# Patient Record
Sex: Female | Born: 1960
Health system: Southern US, Community
[De-identification: ages and names within clinical notes are randomized; demographics above are authoritative.]

## PROBLEM LIST (undated history)

## (undated) DIAGNOSIS — Z803 Family history of malignant neoplasm of breast: Secondary | ICD-10-CM

## (undated) DIAGNOSIS — N200 Calculus of kidney: Secondary | ICD-10-CM

## (undated) HISTORY — DX: Family history of malignant neoplasm of breast: Z80.3

## (undated) HISTORY — DX: Calculus of kidney: N20.0

---

## 1999-03-06 ENCOUNTER — Encounter: Payer: Self-pay | Admitting: Urology

## 1999-03-06 ENCOUNTER — Ambulatory Visit (HOSPITAL_COMMUNITY): Admission: RE | Admit: 1999-03-06 | Discharge: 1999-03-06 | Payer: Self-pay | Admitting: Urology

## 1999-03-07 ENCOUNTER — Emergency Department (HOSPITAL_COMMUNITY): Admission: EM | Admit: 1999-03-07 | Discharge: 1999-03-07 | Payer: Self-pay | Admitting: Emergency Medicine

## 1999-05-15 ENCOUNTER — Other Ambulatory Visit: Admission: RE | Admit: 1999-05-15 | Discharge: 1999-05-15 | Payer: Self-pay | Admitting: *Deleted

## 2000-05-27 ENCOUNTER — Other Ambulatory Visit: Admission: RE | Admit: 2000-05-27 | Discharge: 2000-05-27 | Payer: Self-pay | Admitting: *Deleted

## 2001-08-15 ENCOUNTER — Other Ambulatory Visit: Admission: RE | Admit: 2001-08-15 | Discharge: 2001-08-15 | Payer: Self-pay | Admitting: *Deleted

## 2002-05-28 ENCOUNTER — Other Ambulatory Visit: Admission: RE | Admit: 2002-05-28 | Discharge: 2002-05-28 | Payer: Self-pay | Admitting: *Deleted

## 2003-12-25 ENCOUNTER — Encounter: Admission: RE | Admit: 2003-12-25 | Discharge: 2003-12-25 | Payer: Self-pay | Admitting: Family Medicine

## 2004-09-08 ENCOUNTER — Other Ambulatory Visit: Admission: RE | Admit: 2004-09-08 | Discharge: 2004-09-08 | Payer: Self-pay | Admitting: Family Medicine

## 2005-10-04 ENCOUNTER — Other Ambulatory Visit: Admission: RE | Admit: 2005-10-04 | Discharge: 2005-10-04 | Payer: Self-pay | Admitting: Family Medicine

## 2005-11-08 ENCOUNTER — Encounter: Payer: Self-pay | Admitting: Family Medicine

## 2005-11-30 ENCOUNTER — Encounter: Admission: RE | Admit: 2005-11-30 | Discharge: 2005-11-30 | Payer: Self-pay | Admitting: Family Medicine

## 2006-11-28 ENCOUNTER — Encounter: Admission: RE | Admit: 2006-11-28 | Discharge: 2006-11-28 | Payer: Self-pay | Admitting: Family Medicine

## 2007-12-28 ENCOUNTER — Encounter: Admission: RE | Admit: 2007-12-28 | Discharge: 2007-12-28 | Payer: Self-pay | Admitting: Family Medicine

## 2009-02-06 ENCOUNTER — Encounter: Admission: RE | Admit: 2009-02-06 | Discharge: 2009-02-06 | Payer: Self-pay | Admitting: Family Medicine

## 2009-02-13 ENCOUNTER — Encounter: Admission: RE | Admit: 2009-02-13 | Discharge: 2009-02-13 | Payer: Self-pay | Admitting: Family Medicine

## 2010-05-18 ENCOUNTER — Encounter: Admission: RE | Admit: 2010-05-18 | Discharge: 2010-05-18 | Payer: Self-pay | Admitting: Family Medicine

## 2010-11-04 ENCOUNTER — Encounter: Admission: RE | Admit: 2010-11-04 | Discharge: 2010-11-04 | Payer: Self-pay | Admitting: Family Medicine

## 2011-01-03 ENCOUNTER — Encounter: Payer: Self-pay | Admitting: Family Medicine

## 2011-07-27 ENCOUNTER — Other Ambulatory Visit: Payer: Self-pay | Admitting: Family Medicine

## 2011-07-27 ENCOUNTER — Other Ambulatory Visit (HOSPITAL_COMMUNITY)
Admission: RE | Admit: 2011-07-27 | Discharge: 2011-07-27 | Disposition: A | Discharge status: Home / Self Care | Payer: 59 | Source: Ambulatory Visit | Attending: Family Medicine | Admitting: Family Medicine

## 2011-07-27 DIAGNOSIS — Z1159 Encounter for screening for other viral diseases: Secondary | ICD-10-CM | POA: Insufficient documentation

## 2011-07-27 DIAGNOSIS — Z124 Encounter for screening for malignant neoplasm of cervix: Secondary | ICD-10-CM | POA: Insufficient documentation

## 2012-07-05 ENCOUNTER — Other Ambulatory Visit: Payer: Self-pay | Admitting: Family Medicine

## 2012-07-05 DIAGNOSIS — Z1231 Encounter for screening mammogram for malignant neoplasm of breast: Secondary | ICD-10-CM

## 2012-07-17 ENCOUNTER — Ambulatory Visit
Admission: RE | Admit: 2012-07-17 | Discharge: 2012-07-17 | Disposition: A | Discharge status: Home / Self Care | Payer: 59 | Source: Ambulatory Visit | Attending: Family Medicine | Admitting: Family Medicine

## 2012-07-17 DIAGNOSIS — Z1231 Encounter for screening mammogram for malignant neoplasm of breast: Secondary | ICD-10-CM

## 2013-07-10 ENCOUNTER — Other Ambulatory Visit: Payer: Self-pay

## 2013-07-10 DIAGNOSIS — Z1231 Encounter for screening mammogram for malignant neoplasm of breast: Secondary | ICD-10-CM

## 2013-07-25 ENCOUNTER — Ambulatory Visit: Payer: 59

## 2013-08-29 ENCOUNTER — Ambulatory Visit
Admission: RE | Admit: 2013-08-29 | Discharge: 2013-08-29 | Disposition: A | Discharge status: Home / Self Care | Payer: 59 | Source: Ambulatory Visit

## 2013-08-29 DIAGNOSIS — Z1231 Encounter for screening mammogram for malignant neoplasm of breast: Secondary | ICD-10-CM

## 2013-08-31 ENCOUNTER — Other Ambulatory Visit: Payer: Self-pay

## 2013-08-31 DIAGNOSIS — R928 Other abnormal and inconclusive findings on diagnostic imaging of breast: Secondary | ICD-10-CM

## 2013-09-14 ENCOUNTER — Ambulatory Visit
Admission: RE | Admit: 2013-09-14 | Discharge: 2013-09-14 | Disposition: A | Discharge status: Home / Self Care | Payer: 59 | Source: Ambulatory Visit

## 2013-09-14 DIAGNOSIS — R928 Other abnormal and inconclusive findings on diagnostic imaging of breast: Secondary | ICD-10-CM

## 2014-02-11 ENCOUNTER — Other Ambulatory Visit: Payer: Self-pay | Admitting: Family Medicine

## 2014-02-11 DIAGNOSIS — R928 Other abnormal and inconclusive findings on diagnostic imaging of breast: Secondary | ICD-10-CM

## 2014-02-21 ENCOUNTER — Ambulatory Visit
Admission: RE | Admit: 2014-02-21 | Discharge: 2014-02-21 | Disposition: A | Discharge status: Home / Self Care | Payer: 59 | Source: Ambulatory Visit | Attending: Family Medicine | Admitting: Family Medicine

## 2014-02-21 DIAGNOSIS — R928 Other abnormal and inconclusive findings on diagnostic imaging of breast: Secondary | ICD-10-CM

## 2014-08-27 ENCOUNTER — Other Ambulatory Visit: Payer: Self-pay

## 2014-08-27 DIAGNOSIS — Z1231 Encounter for screening mammogram for malignant neoplasm of breast: Secondary | ICD-10-CM

## 2014-09-10 ENCOUNTER — Ambulatory Visit
Admission: RE | Admit: 2014-09-10 | Discharge: 2014-09-10 | Disposition: A | Discharge status: Home / Self Care | Payer: 59 | Source: Ambulatory Visit

## 2014-09-10 DIAGNOSIS — Z1231 Encounter for screening mammogram for malignant neoplasm of breast: Secondary | ICD-10-CM

## 2015-06-24 ENCOUNTER — Other Ambulatory Visit: Payer: Self-pay | Admitting: Orthopedic Surgery

## 2015-06-24 DIAGNOSIS — Q6689 Other  specified congenital deformities of feet: Secondary | ICD-10-CM

## 2015-06-27 ENCOUNTER — Other Ambulatory Visit: Payer: Self-pay

## 2015-07-07 ENCOUNTER — Ambulatory Visit
Admission: RE | Admit: 2015-07-07 | Discharge: 2015-07-07 | Disposition: A | Discharge status: Home / Self Care | Payer: 59 | Source: Ambulatory Visit | Attending: Orthopedic Surgery | Admitting: Orthopedic Surgery

## 2015-07-07 DIAGNOSIS — Q6689 Other  specified congenital deformities of feet: Secondary | ICD-10-CM

## 2015-09-08 ENCOUNTER — Other Ambulatory Visit: Payer: Self-pay

## 2015-09-08 DIAGNOSIS — Z1231 Encounter for screening mammogram for malignant neoplasm of breast: Secondary | ICD-10-CM

## 2015-10-13 ENCOUNTER — Inpatient Hospital Stay: Admission: RE | Admit: 2015-10-13 | Payer: 59 | Source: Ambulatory Visit

## 2015-11-03 ENCOUNTER — Ambulatory Visit
Admission: RE | Admit: 2015-11-03 | Discharge: 2015-11-03 | Disposition: A | Discharge status: Home / Self Care | Payer: 59 | Source: Ambulatory Visit

## 2015-11-03 DIAGNOSIS — Z1231 Encounter for screening mammogram for malignant neoplasm of breast: Secondary | ICD-10-CM

## 2015-11-17 ENCOUNTER — Other Ambulatory Visit (HOSPITAL_COMMUNITY)
Admission: RE | Admit: 2015-11-17 | Discharge: 2015-11-17 | Disposition: A | Discharge status: Home / Self Care | Payer: 59 | Source: Ambulatory Visit | Attending: Family Medicine | Admitting: Family Medicine

## 2015-11-17 ENCOUNTER — Other Ambulatory Visit: Payer: Self-pay | Admitting: Family Medicine

## 2015-11-17 DIAGNOSIS — Z124 Encounter for screening for malignant neoplasm of cervix: Secondary | ICD-10-CM | POA: Diagnosis not present

## 2015-11-18 LAB — CYTOLOGY - PAP

## 2016-03-15 DIAGNOSIS — E7800 Pure hypercholesterolemia, unspecified: Secondary | ICD-10-CM | POA: Diagnosis not present

## 2016-05-04 DIAGNOSIS — M7502 Adhesive capsulitis of left shoulder: Secondary | ICD-10-CM | POA: Diagnosis not present

## 2016-05-04 DIAGNOSIS — M25512 Pain in left shoulder: Secondary | ICD-10-CM | POA: Diagnosis not present

## 2016-05-04 DIAGNOSIS — G8929 Other chronic pain: Secondary | ICD-10-CM | POA: Diagnosis not present

## 2016-05-17 DIAGNOSIS — M7502 Adhesive capsulitis of left shoulder: Secondary | ICD-10-CM | POA: Diagnosis not present

## 2016-05-31 DIAGNOSIS — M7502 Adhesive capsulitis of left shoulder: Secondary | ICD-10-CM | POA: Diagnosis not present

## 2016-06-21 DIAGNOSIS — M7502 Adhesive capsulitis of left shoulder: Secondary | ICD-10-CM | POA: Diagnosis not present

## 2016-07-23 DIAGNOSIS — Z8249 Family history of ischemic heart disease and other diseases of the circulatory system: Secondary | ICD-10-CM | POA: Diagnosis not present

## 2016-07-23 DIAGNOSIS — M62838 Other muscle spasm: Secondary | ICD-10-CM | POA: Diagnosis not present

## 2016-07-23 DIAGNOSIS — E559 Vitamin D deficiency, unspecified: Secondary | ICD-10-CM | POA: Diagnosis not present

## 2016-07-23 DIAGNOSIS — E782 Mixed hyperlipidemia: Secondary | ICD-10-CM | POA: Diagnosis not present

## 2016-08-23 ENCOUNTER — Other Ambulatory Visit: Payer: Self-pay | Admitting: Family Medicine

## 2016-08-23 DIAGNOSIS — Z86018 Personal history of other benign neoplasm: Secondary | ICD-10-CM | POA: Diagnosis not present

## 2016-08-23 DIAGNOSIS — M62838 Other muscle spasm: Secondary | ICD-10-CM | POA: Diagnosis not present

## 2016-08-23 DIAGNOSIS — E559 Vitamin D deficiency, unspecified: Secondary | ICD-10-CM | POA: Diagnosis not present

## 2016-08-23 DIAGNOSIS — Z1231 Encounter for screening mammogram for malignant neoplasm of breast: Secondary | ICD-10-CM

## 2016-08-23 DIAGNOSIS — Z85828 Personal history of other malignant neoplasm of skin: Secondary | ICD-10-CM | POA: Diagnosis not present

## 2016-08-23 DIAGNOSIS — R739 Hyperglycemia, unspecified: Secondary | ICD-10-CM | POA: Diagnosis not present

## 2016-08-23 DIAGNOSIS — B079 Viral wart, unspecified: Secondary | ICD-10-CM | POA: Diagnosis not present

## 2016-08-23 DIAGNOSIS — E782 Mixed hyperlipidemia: Secondary | ICD-10-CM | POA: Diagnosis not present

## 2016-08-23 DIAGNOSIS — L814 Other melanin hyperpigmentation: Secondary | ICD-10-CM | POA: Diagnosis not present

## 2016-08-23 DIAGNOSIS — D225 Melanocytic nevi of trunk: Secondary | ICD-10-CM | POA: Diagnosis not present

## 2016-08-23 DIAGNOSIS — D485 Neoplasm of uncertain behavior of skin: Secondary | ICD-10-CM | POA: Diagnosis not present

## 2016-09-06 DIAGNOSIS — L570 Actinic keratosis: Secondary | ICD-10-CM | POA: Diagnosis not present

## 2016-09-22 DIAGNOSIS — Z23 Encounter for immunization: Secondary | ICD-10-CM | POA: Diagnosis not present

## 2016-10-20 DIAGNOSIS — R350 Frequency of micturition: Secondary | ICD-10-CM | POA: Diagnosis not present

## 2016-10-20 DIAGNOSIS — N200 Calculus of kidney: Secondary | ICD-10-CM | POA: Diagnosis not present

## 2016-10-25 DIAGNOSIS — J069 Acute upper respiratory infection, unspecified: Secondary | ICD-10-CM | POA: Diagnosis not present

## 2016-10-25 DIAGNOSIS — H109 Unspecified conjunctivitis: Secondary | ICD-10-CM | POA: Diagnosis not present

## 2016-11-09 DIAGNOSIS — N200 Calculus of kidney: Secondary | ICD-10-CM | POA: Diagnosis not present

## 2016-11-29 ENCOUNTER — Ambulatory Visit: Payer: Self-pay | Admitting: Gynecology

## 2016-12-01 DIAGNOSIS — R8271 Bacteriuria: Secondary | ICD-10-CM | POA: Diagnosis not present

## 2016-12-01 DIAGNOSIS — R300 Dysuria: Secondary | ICD-10-CM | POA: Diagnosis not present

## 2016-12-02 DIAGNOSIS — N201 Calculus of ureter: Secondary | ICD-10-CM | POA: Diagnosis not present

## 2016-12-20 ENCOUNTER — Ambulatory Visit
Admission: RE | Admit: 2016-12-20 | Discharge: 2016-12-20 | Disposition: A | Discharge status: Home / Self Care | Payer: BLUE CROSS/BLUE SHIELD | Source: Ambulatory Visit | Attending: Family Medicine | Admitting: Family Medicine

## 2016-12-20 DIAGNOSIS — Z1231 Encounter for screening mammogram for malignant neoplasm of breast: Secondary | ICD-10-CM | POA: Diagnosis not present

## 2017-01-10 ENCOUNTER — Ambulatory Visit: Payer: Self-pay | Admitting: Gynecology

## 2017-01-24 ENCOUNTER — Telehealth: Payer: Self-pay | Admitting: *Deleted

## 2017-01-24 ENCOUNTER — Ambulatory Visit (INDEPENDENT_AMBULATORY_CARE_PROVIDER_SITE_OTHER): Payer: BLUE CROSS/BLUE SHIELD | Admitting: Gynecology

## 2017-01-24 ENCOUNTER — Encounter: Payer: Self-pay | Admitting: Gynecology

## 2017-01-24 VITALS — BP 114/76 | Ht 61.5 in | Wt 124.0 lb

## 2017-01-24 DIAGNOSIS — Z803 Family history of malignant neoplasm of breast: Secondary | ICD-10-CM

## 2017-01-24 DIAGNOSIS — N952 Postmenopausal atrophic vaginitis: Secondary | ICD-10-CM | POA: Diagnosis not present

## 2017-01-24 MED ORDER — ESTRADIOL 10 MCG VA TABS: 1.0000 | ORAL_TABLET | Freq: Every day | VAGINAL | 3 refills | Status: DC

## 2017-01-24 NOTE — Progress Notes (Signed)
Deborah Phillips September 10, 1961 [id number]        56 y.o.  G3P3 presents in consultation referred from urology in reference to postmenopausal bleeding. Is being followed for renal lithiasis by urology and had discussed her menstrual history with irregular menses. Apparently they thought that she was postmenopausal and had a bleeding episode and referred her to be seen. Patient notes she has menses every 4 months or so which have been spacing out over the last year or 2. Also developing hot flushes and vaginal dryness with dyspareunia. She specifically followed up today to discuss her history of dyspareunia and to investigate treatment options. She obtains her routine gynecologic care through Dr. Juluis Rainier and has been having annual exams to include breast exams and pelvic exams. Her Pap smears have remained normal with her last Pap smear 11/2015. She is up-to-date with mammography with her last mammogram 12/2016 and her last colonoscopy 2012.  Past medical history,surgical history, problem list, medications, allergies, family history and social history were all reviewed and documented in the EPIC chart.  Directed ROS with pertinent positives and negatives documented in the history of present illness/assessment and plan.  Exam: Kennon Portela assistant Vitals:   01/24/17 1509  BP: 114/76  Weight: 124 lb (56.2 kg)  Height: 5' 1.5" (1.562 m)   General appearance:  Normal Abdomen soft nontender without masses guarding rebound organomegaly. Pelvic external BUS vagina with atrophic changes. Cervix with atrophic changes. Uterus grossly normal size midline mobile nontender. Adnexa without masses or tenderness.  Assessment/Plan:  56 y.o. G3P3 in the perimenopause with less frequent menses and onset of hot flushes and vaginal dryness. She does know when she is ovulating with moliminal symptoms to include breast tenderness and bloating and notes that she does this sporadically each year. She's never had  prolonged or atypical bleeding. Her main issue is vaginal dryness and dyspareunia. I reviewed treatment options with her to include OTC products both oral/soy based and vaginal such as vaginal lubricant/moisturizers. I also reviewed vaginal estrogen supplementation as a directed treatment to her vaginal complaints as well as full HRT which will address her hot flushes as well as her vaginal complaints. I reviewed the various studies to include the WHI study with increased risk of stroke heart attack DVT and breast cancer as well as the potential benefits from a symptom relief standpoint cardiovascular/bone health. Vaginal estrogen supplementation was also discussed and creams/Vagifem/Osphena options reviewed. Risks of absorption with systemic effects also discussed to include breast simulation, thrombosis as well as endometrial stimulation. After a lengthy discussion the patient wants a trial of Vagifem 10 g. I reviewed the physiology and that will take some time to take effect. The need to use twice weekly after the 14 day loading reviewed. #28 with 3 refills provided. Patient will start now call me if she has any issues with this. From a perimenopausal standpoint she's going to monitor her menses as long as less frequent but regular 1 occur she'll follow. If prolonged or atypical bleeding per goes more than 1 year without bleeding then she will call.   Lastly the patient has a very strong family history of breast cancer noting her mother developed breast cancer at age 56 her sister at age 56. Her mother died in her 56s. But her sister succumbed to breast cancer. Neither were genetically tested to the patient's knowledge. I strongly recommended she consider genetic testing and she agrees to discuss this with the genetic counselor which we will help her  arrange. She knows to call my office if she does not hear from the genetic counselors to set up the appointment.  Greater than 50% of my time was spent in  direct face to face counseling and coordination of care with the patient.     Dara LordsFONTAINE,Tery Hoeger P MD, 3:53 PM 01/24/2017

## 2017-01-24 NOTE — Telephone Encounter (Signed)
Orders placed they will contact pt to schedule. 

## 2017-01-24 NOTE — Patient Instructions (Signed)
Start the vaginal estrogen tablets nightly for 14 nights then twice weekly

## 2017-01-24 NOTE — Telephone Encounter (Signed)
-----   Message from Dara Lordsimothy P Fontaine, MD sent at 01/24/2017  3:52 PM EST ----- Arrange [facility] genetic counseling reference mother with breast cancer age 56, sister with breast cancer age 56

## 2017-01-27 ENCOUNTER — Encounter: Payer: Self-pay | Admitting: Genetic Counselor

## 2017-01-27 ENCOUNTER — Telehealth: Payer: Self-pay | Admitting: Genetic Counselor

## 2017-01-27 NOTE — Telephone Encounter (Signed)
Appt has been scheduled for the pt to see Maylon CosKaren Powell on 9/10 at 9am per her request. Demographics verified. Letter mailed.

## 2017-02-02 NOTE — Telephone Encounter (Signed)
Appointment on 08/22/17  

## 2017-03-14 DIAGNOSIS — R7303 Prediabetes: Secondary | ICD-10-CM | POA: Diagnosis not present

## 2017-03-14 DIAGNOSIS — E7800 Pure hypercholesterolemia, unspecified: Secondary | ICD-10-CM | POA: Diagnosis not present

## 2017-04-01 DIAGNOSIS — N202 Calculus of kidney with calculus of ureter: Secondary | ICD-10-CM | POA: Diagnosis not present

## 2017-06-21 DIAGNOSIS — L814 Other melanin hyperpigmentation: Secondary | ICD-10-CM | POA: Diagnosis not present

## 2017-06-21 DIAGNOSIS — L570 Actinic keratosis: Secondary | ICD-10-CM | POA: Diagnosis not present

## 2017-06-21 DIAGNOSIS — L821 Other seborrheic keratosis: Secondary | ICD-10-CM | POA: Diagnosis not present

## 2017-06-21 DIAGNOSIS — D1801 Hemangioma of skin and subcutaneous tissue: Secondary | ICD-10-CM | POA: Diagnosis not present

## 2017-06-21 DIAGNOSIS — Z85828 Personal history of other malignant neoplasm of skin: Secondary | ICD-10-CM | POA: Diagnosis not present

## 2017-08-01 DIAGNOSIS — E7800 Pure hypercholesterolemia, unspecified: Secondary | ICD-10-CM | POA: Diagnosis not present

## 2017-08-01 DIAGNOSIS — E559 Vitamin D deficiency, unspecified: Secondary | ICD-10-CM | POA: Diagnosis not present

## 2017-08-01 DIAGNOSIS — E782 Mixed hyperlipidemia: Secondary | ICD-10-CM | POA: Diagnosis not present

## 2017-08-01 DIAGNOSIS — Z0000 Encounter for general adult medical examination without abnormal findings: Secondary | ICD-10-CM | POA: Diagnosis not present

## 2017-08-01 DIAGNOSIS — R7303 Prediabetes: Secondary | ICD-10-CM | POA: Diagnosis not present

## 2017-08-22 ENCOUNTER — Encounter: Payer: Self-pay | Admitting: Genetic Counselor

## 2017-08-22 ENCOUNTER — Other Ambulatory Visit: Payer: BLUE CROSS/BLUE SHIELD

## 2017-08-22 ENCOUNTER — Ambulatory Visit (HOSPITAL_BASED_OUTPATIENT_CLINIC_OR_DEPARTMENT_OTHER): Payer: BLUE CROSS/BLUE SHIELD | Admitting: Genetic Counselor

## 2017-08-22 DIAGNOSIS — Z803 Family history of malignant neoplasm of breast: Secondary | ICD-10-CM | POA: Diagnosis not present

## 2017-08-22 DIAGNOSIS — Z7183 Encounter for nonprocreative genetic counseling: Secondary | ICD-10-CM | POA: Diagnosis not present

## 2017-08-22 NOTE — Progress Notes (Signed)
REFERRING PROVIDER: Anastasio Auerbach, MD [address] [place], [place] [address]  PRIMARY PROVIDER:  Leighton Ruff, MD  PRIMARY REASON FOR VISIT:  1. Family history of breast cancer      HISTORY OF PRESENT ILLNESS:   Ms. Wolaver, a 56 y.o. female, was seen for a [facility] cancer genetics consultation at the request of Dr. Phineas Real due to a family history of cancer.  Ms. Panek presents to clinic today to discuss the possibility of a hereditary predisposition to cancer, genetic testing, and to further clarify her future cancer risks, as well as potential cancer risks for family members.  Ms. Boggess is a 56 y.o. female with no personal history of cancer.  She was referred because of her concern for her 3 daughters and their risk for breast cancer.  CANCER HISTORY:   No history exists.     HORMONAL RISK FACTORS:  Menarche was at age 23.  First live birth at age 32.  OCP use for approximately unknown years.  Ovaries intact: yes.  Hysterectomy: no.  Menopausal status: postmenopausal.  HRT use: 0 years. Colonoscopy: yes; normal. Mammogram within the last year: yes. Number of breast biopsies: 0. Up to date with pelvic exams:  yes. Any excessive radiation exposure in the past:  Patient had a barium enema in her teens, and has had several X-rays and screens.  Past Medical History:  Diagnosis Date  . Family history of breast cancer   . Kidney stone     History reviewed. No pertinent surgical history.  Social History   Social History  . Marital status: Married    Spouse name: N/A  . Number of children: N/A  . Years of education: N/A   Social History Main Topics  . Smoking status: Never Smoker  . Smokeless tobacco: Never Used  . Alcohol use No  . Drug use: No  . Sexual activity: Yes    Birth control/ protection: Other-see comments     Comment: 1st intercourse 19 yo-1 partner-Vasectomy   Other Topics Concern  . None   Social History Narrative  . None      FAMILY HISTORY:  We obtained a detailed, 4-generation family history.  Significant diagnoses are listed below: Family History  Problem Relation Age of Onset  . Breast cancer Mother 27  . Lung cancer Mother        heavy smoker  . Heart disease Father   . Stroke Father   . Breast cancer Sister 87       second breast cancer at 56  . Congenital heart disease Brother        died at 65    The patient has three daughters who are cancer free. She has two sisters and four brothers.  One sister was diagnosed with breast cancer at 21 and had a lumpectomy.  She was diagnosed again at 69 and had a bilateral mastectomy.  She died at 41.  One brother was born with a heart defect and had several surgeries before he was 58.  He had learning disabilities, and never learned to read. He died at 2 from issues with his heart.  The remaining siblings are cancer free and their children have not had cancer.  Both parents are deceased.  The patient's mother was diagnosed with breast cancer at age 63.  She had a mastectomy and lived until age 71 when she was diagnosed with lung cancer. She had been a heavy smoker.  She had two  brothers and a sister, none had cancer.  Both maternal grandparents died, but the patient did not know the cause.  The patient's father died at age 58 from a bleeding ulcer.  He had a stroke and heart attacks prior to his death.  He had two adopted brothers, but no biological siblings.  His mother died of old age around age 61 and his father died before the patient was born.  Ms. Glidden is unaware of previous family history of genetic testing for hereditary cancer risks. Patient's maternal ancestors are of Caucasian descent, and paternal ancestors are of [place] and [place] descent. There is no reported Ashkenazi Jewish ancestry. There is no known consanguinity.  GENETIC COUNSELING ASSESSMENT: Suzie Vandam is a 56 y.o. female with a family history of breast cancer which is somewhat suggestive of a  hereditary cancer syndrome and predisposition to cancer. We, therefore, discussed and recommended the following at today's visit.   DISCUSSION: We discussed that about 5-10% of breast cancer is hereditary with most cases due to BRCA mutations.  There are other genes that are less penetrant that we can test for as well.  We reviewed the characteristics, features and inheritance patterns of hereditary cancer syndromes. We also discussed genetic testing, including the appropriate family members to test, the process of testing, insurance coverage and turn-around-time for results. We discussed the implications of a negative, positive and/or variant of uncertain significant result. We recommended Ms. Katen pursue genetic testing for the common hereditary cancer gene panel. The Hereditary Gene Panel offered by [facility] includes sequencing and/or deletion duplication testing of the following 46 genes: APC, ATM, AXIN2, BARD1, BMPR1A, BRCA1, BRCA2, BRIP1, CDH1, CDKN2A (p14ARF), CDKN2A (p16INK4a), CHEK2, CTNNA1, DICER1, EPCAM (Deletion/duplication testing only), GREM1 (promoter region deletion/duplication testing only), KIT, MEN1, MLH1, MSH2, MSH3, MSH6, MUTYH, NBN, NF1, NHTL1, PALB2, PDGFRA, PMS2, POLD1, POLE, PTEN, RAD50, RAD51C, RAD51D, SDHB, SDHC, SDHD, SMAD4, SMARCA4. STK11, TP53, TSC1, TSC2, and VHL.  The following genes were evaluated for sequence changes only: SDHA and HOXB13 c.251G>A variant only.  Based on Ms. Daris's family history of cancer, she meets medical criteria for genetic testing. Despite that she meets criteria, she may still have an out of pocket cost. We discussed that if her out of pocket cost for testing is over $100, the laboratory will call and confirm whether she wants to proceed with testing.  If the out of pocket cost of testing is less than $100 she will be billed by the genetic testing laboratory.   In order to estimate her chance of having a BRCA mutation, we used statistical models (Tyrer  Cusik and PENN II) and laboratory data that take into account her personal medical history, family history and ancestry.  Because each model is different, there can be a lot of variability in the risks they give.  Therefore, these numbers must be considered a rough range and not a precise risk of having a BRCA mutation.  These models estimate that she has approximately a 8.0-8.68% chance of having a mutation.   Based on the patient's personal and family history, statistical models (Tyrer Cusik and [place])  and literature data were used to estimate her risk of developing breast cancer. These estimate her lifetime risk of developing breast cancer to be approximately 21% to 25.2%. This estimation does not take into account any genetic testing results.  The patient's lifetime breast cancer risk is a preliminary estimate based on available information using one of several models endorsed by the [place] (  ACS). The ACS recommends consideration of breast MRI screening as an adjunct to mammography for patients at high risk (defined as 20% or greater lifetime risk). A more detailed breast cancer risk assessment can be considered, if clinically indicated.   Ms. Sinkler has been determined to be at high risk for breast cancer.  Therefore, we recommend that annual screening with mammography and breast MRI.  We discussed that Ms. Luscher should discuss her individual situation with her referring physician and determine a breast cancer screening plan with which they are both comfortable.          PLAN: After considering the risks, benefits, and limitations, Ms. Dokes  provided informed consent to pursue genetic testing and the blood sample was sent to [facility] for analysis of the Common Hereditary cancer panel. Results should be available within approximately 2-3 weeks' time, at which point they will be disclosed by telephone to Ms. Sonnenberg, as will any additional recommendations warranted by these  results. Ms. Anstine will receive a summary of her genetic counseling visit and a copy of her results once available. This information will also be available in Epic. We encouraged Ms. Hausman to remain in contact with cancer genetics annually so that we can continuously update the family history and inform her of any changes in cancer genetics and testing that may be of benefit for her family. Ms. Ferre questions were answered to her satisfaction today. Our contact information was provided should additional questions or concerns arise.  Lastly, we encouraged Ms. Geving to remain in contact with cancer genetics annually so that we can continuously update the family history and inform her of any changes in cancer genetics and testing that may be of benefit for this family.   Ms.  Disbro questions were answered to her satisfaction today. Our contact information was provided should additional questions or concerns arise. Thank you for the referral and allowing [place] to share in the care of your patient.   Karen P. Florene Glen, [place], [facility] Certified Genetic Counselor [email or web address] phone: [phone]  The patient was seen for a total of 60 minutes in face-to-face genetic counseling.  This patient was discussed with Drs. Magrinat, Lindi Adie and/or Burr Medico who agrees with the above.    _______________________________________________________________________ For Office Staff:  Number of people involved in session: 1 Was an Intern/ student involved with case: yes: Rise Paganini

## 2017-09-15 ENCOUNTER — Telehealth: Payer: Self-pay | Admitting: Genetic Counselor

## 2017-09-15 ENCOUNTER — Encounter: Payer: Self-pay | Admitting: Genetic Counselor

## 2017-09-15 DIAGNOSIS — Z1379 Encounter for other screening for genetic and chromosomal anomalies: Secondary | ICD-10-CM | POA: Insufficient documentation

## 2017-09-15 NOTE — Telephone Encounter (Signed)
LM on VM asking her to CB.

## 2017-09-20 ENCOUNTER — Ambulatory Visit: Payer: Self-pay | Admitting: Genetic Counselor

## 2017-09-20 DIAGNOSIS — Z803 Family history of malignant neoplasm of breast: Secondary | ICD-10-CM

## 2017-09-20 DIAGNOSIS — Z1379 Encounter for other screening for genetic and chromosomal anomalies: Secondary | ICD-10-CM

## 2017-09-20 NOTE — Progress Notes (Signed)
HPI: Ms. Cuello was previously seen in the [facility] clinic due to a family history of cancer and concerns regarding a hereditary predisposition to cancer. Please refer to our prior cancer genetics clinic note for more information regarding Ms. Wainright medical, social and family histories, and our assessment and recommendations, at the time. Ms. Lagunes recent genetic test results were disclosed to her, as were recommendations warranted by these results. These results and recommendations are discussed in more detail below.  CANCER HISTORY:   No history exists.    FAMILY HISTORY:  We obtained a detailed, 4-generation family history.  Significant diagnoses are listed below: Family History  Problem Relation Age of Onset  . Breast cancer Mother 42  . Lung cancer Mother        heavy smoker  . Heart disease Father   . Stroke Father   . Breast cancer Sister 16       second breast cancer at 51  . Congenital heart disease Brother        died at 63    The patient has three daughters who are cancer free. She has two sisters and four brothers.  One sister was diagnosed with breast cancer at 37 and had a lumpectomy.  She was diagnosed again at 64 and had a bilateral mastectomy.  She died at 72.  One brother was born with a heart defect and had several surgeries before he was 109.  He had learning disabilities, and never learned to read. He died at 77 from issues with his heart.  The remaining siblings are cancer free and their children have not had cancer.  Both parents are deceased.  The patient's mother was diagnosed with breast cancer at age 70.  She had a mastectomy and lived until age 77 when she was diagnosed with lung cancer. She had been a heavy smoker.  She had two brothers and a sister, none had cancer.  Both maternal grandparents died, but the patient did not know the cause.  The patient's father died at age 60 from a bleeding ulcer.  He had a stroke and heart attacks prior to  his death.  He had two adopted brothers, but no biological siblings.  His mother died of old age around age 20 and his father died before the patient was born.  Ms. Saur is unaware of previous family history of genetic testing for hereditary cancer risks. Patient's maternal ancestors are of Caucasian descent, and paternal ancestors are of [place] and [place] descent. There is no reported Ashkenazi Jewish ancestry. There is no known consanguinity.  GENETIC TEST RESULTS: Genetic testing reported out on September 13, 2017 through the common hereditary cancer panel found no deleterious mutations.  The Hereditary Gene Panel offered by [facility] includes sequencing and/or deletion duplication testing of the following 46 genes: APC, ATM, AXIN2, BARD1, BMPR1A, BRCA1, BRCA2, BRIP1, CDH1, CDKN2A (p14ARF), CDKN2A (p16INK4a), CHEK2, CTNNA1, DICER1, EPCAM (Deletion/duplication testing only), GREM1 (promoter region deletion/duplication testing only), KIT, MEN1, MLH1, MSH2, MSH3, MSH6, MUTYH, NBN, NF1, NHTL1, PALB2, PDGFRA, PMS2, POLD1, POLE, PTEN, RAD50, RAD51C, RAD51D, SDHB, SDHC, SDHD, SMAD4, SMARCA4. STK11, TP53, TSC1, TSC2, and VHL.  The following genes were evaluated for sequence changes only: SDHA and HOXB13 c.251G>A variant only.  The test report has been scanned into EPIC and is located under the Molecular Pathology section of the Results Review tab.   We discussed with Ms. Muro that since the current genetic testing is not perfect, it is possible there may  be a gene mutation in one of these genes that current testing cannot detect, but that chance is small. We also discussed, that it is possible that another gene that has not yet been discovered, or that we have not yet tested, is responsible for the cancer diagnoses in the family, and it is, therefore, important to remain in touch with cancer genetics in the future so that we can continue to offer Ms. Doland the most up to date genetic testing.    CANCER SCREENING  RECOMMENDATIONS:  This normal result is reassuring and indicates that Ms. Dangerfield does not likely have an increased risk of cancer due to a mutation in one of these genes.  We, therefore, recommended  Ms. Hortman continue to follow the cancer screening guidelines provided by her primary healthcare providers.   RECOMMENDATIONS FOR FAMILY MEMBERS: Women in this family might be at some increased risk of developing cancer, over the general population risk, simply due to the family history of cancer. We recommended women in this family have a yearly mammogram beginning at age 56, or 12 years younger than the earliest onset of cancer, an annual clinical breast exam, and perform monthly breast self-exams. Women in this family should also have a gynecological exam as recommended by their primary provider. All family members should have a colonoscopy by age 56.  Based on Ms. Nuzum's family history, we recommended the daughters of her her sister, who was diagnosed with breast cancer at age 3, have genetic counseling and testing. Ms. Senseney will let us know if we can be of any assistance in coordinating genetic counseling and/or testing for this family member.   FOLLOW-UP: Lastly, we discussed with Ms. Hightower that cancer genetics is a rapidly advancing field and it is possible that new genetic tests will be appropriate for her and/or her family members in the future. We encouraged her to remain in contact with cancer genetics on an annual basis so we can update her personal and family histories and let her know of advances in cancer genetics that may benefit this family.   Our contact number was provided. Ms. Knoff questions were answered to her satisfaction, and she knows she is welcome to call us at anytime with additional questions or concerns.   Roma Kayser, MS, [facility] Certified Genetic Counselor [email or web address]

## 2017-09-21 DIAGNOSIS — Z23 Encounter for immunization: Secondary | ICD-10-CM | POA: Diagnosis not present

## 2017-10-20 DIAGNOSIS — J45909 Unspecified asthma, uncomplicated: Secondary | ICD-10-CM | POA: Diagnosis not present

## 2017-11-10 DIAGNOSIS — R0789 Other chest pain: Secondary | ICD-10-CM | POA: Diagnosis not present

## 2017-11-18 ENCOUNTER — Telehealth: Payer: Self-pay | Admitting: *Deleted

## 2017-11-18 NOTE — Telephone Encounter (Signed)
Patient called requesting medication to help with pain with intercourse, d/c'd the vagifem twice weekly. Pt advised OV with provider best, pt will call back to schedule.

## 2017-11-22 ENCOUNTER — Other Ambulatory Visit: Payer: Self-pay | Admitting: Family Medicine

## 2017-11-22 DIAGNOSIS — Z1231 Encounter for screening mammogram for malignant neoplasm of breast: Secondary | ICD-10-CM

## 2017-11-28 DIAGNOSIS — E782 Mixed hyperlipidemia: Secondary | ICD-10-CM | POA: Diagnosis not present

## 2017-11-28 DIAGNOSIS — H5213 Myopia, bilateral: Secondary | ICD-10-CM | POA: Diagnosis not present

## 2017-12-26 ENCOUNTER — Ambulatory Visit
Admission: RE | Admit: 2017-12-26 | Discharge: 2017-12-26 | Disposition: A | Discharge status: Home / Self Care | Payer: BLUE CROSS/BLUE SHIELD | Source: Ambulatory Visit | Attending: Family Medicine | Admitting: Family Medicine

## 2017-12-26 DIAGNOSIS — Z1231 Encounter for screening mammogram for malignant neoplasm of breast: Secondary | ICD-10-CM | POA: Diagnosis not present

## 2018-04-03 DIAGNOSIS — E7800 Pure hypercholesterolemia, unspecified: Secondary | ICD-10-CM | POA: Diagnosis not present

## 2018-04-03 DIAGNOSIS — R7303 Prediabetes: Secondary | ICD-10-CM | POA: Diagnosis not present

## 2018-05-22 DIAGNOSIS — L814 Other melanin hyperpigmentation: Secondary | ICD-10-CM | POA: Diagnosis not present

## 2018-05-22 DIAGNOSIS — L570 Actinic keratosis: Secondary | ICD-10-CM | POA: Diagnosis not present

## 2018-05-22 DIAGNOSIS — D485 Neoplasm of uncertain behavior of skin: Secondary | ICD-10-CM | POA: Diagnosis not present

## 2018-05-22 DIAGNOSIS — D1801 Hemangioma of skin and subcutaneous tissue: Secondary | ICD-10-CM | POA: Diagnosis not present

## 2018-08-07 ENCOUNTER — Other Ambulatory Visit: Payer: Self-pay | Admitting: Family Medicine

## 2018-08-07 ENCOUNTER — Other Ambulatory Visit (HOSPITAL_COMMUNITY)
Admission: RE | Admit: 2018-08-07 | Discharge: 2018-08-07 | Disposition: A | Discharge status: Home / Self Care | Payer: BLUE CROSS/BLUE SHIELD | Source: Ambulatory Visit | Attending: Family Medicine | Admitting: Family Medicine

## 2018-08-07 DIAGNOSIS — E559 Vitamin D deficiency, unspecified: Secondary | ICD-10-CM | POA: Diagnosis not present

## 2018-08-07 DIAGNOSIS — Z1159 Encounter for screening for other viral diseases: Secondary | ICD-10-CM | POA: Diagnosis not present

## 2018-08-07 DIAGNOSIS — Z0000 Encounter for general adult medical examination without abnormal findings: Secondary | ICD-10-CM | POA: Diagnosis not present

## 2018-08-07 DIAGNOSIS — R7303 Prediabetes: Secondary | ICD-10-CM | POA: Diagnosis not present

## 2018-08-07 DIAGNOSIS — J9801 Acute bronchospasm: Secondary | ICD-10-CM | POA: Diagnosis not present

## 2018-08-07 DIAGNOSIS — Z124 Encounter for screening for malignant neoplasm of cervix: Secondary | ICD-10-CM | POA: Insufficient documentation

## 2018-08-07 DIAGNOSIS — E782 Mixed hyperlipidemia: Secondary | ICD-10-CM | POA: Diagnosis not present

## 2018-08-07 DIAGNOSIS — R002 Palpitations: Secondary | ICD-10-CM | POA: Diagnosis not present

## 2018-08-08 LAB — CYTOLOGY - PAP: Diagnosis: NEGATIVE

## 2018-10-16 DIAGNOSIS — L820 Inflamed seborrheic keratosis: Secondary | ICD-10-CM | POA: Diagnosis not present

## 2018-10-16 DIAGNOSIS — L821 Other seborrheic keratosis: Secondary | ICD-10-CM | POA: Diagnosis not present

## 2018-11-17 ENCOUNTER — Other Ambulatory Visit: Payer: Self-pay | Admitting: Family Medicine

## 2018-11-17 DIAGNOSIS — Z1231 Encounter for screening mammogram for malignant neoplasm of breast: Secondary | ICD-10-CM

## 2018-12-27 ENCOUNTER — Ambulatory Visit
Admission: RE | Admit: 2018-12-27 | Discharge: 2018-12-27 | Disposition: A | Discharge status: Home / Self Care | Payer: BLUE CROSS/BLUE SHIELD | Source: Ambulatory Visit | Attending: Family Medicine | Admitting: Family Medicine

## 2018-12-27 DIAGNOSIS — Z1231 Encounter for screening mammogram for malignant neoplasm of breast: Secondary | ICD-10-CM | POA: Diagnosis not present

## 2019-06-21 DIAGNOSIS — L821 Other seborrheic keratosis: Secondary | ICD-10-CM | POA: Diagnosis not present

## 2019-06-21 DIAGNOSIS — Z86018 Personal history of other benign neoplasm: Secondary | ICD-10-CM | POA: Diagnosis not present

## 2019-06-21 DIAGNOSIS — Z85828 Personal history of other malignant neoplasm of skin: Secondary | ICD-10-CM | POA: Diagnosis not present

## 2019-06-21 DIAGNOSIS — L814 Other melanin hyperpigmentation: Secondary | ICD-10-CM | POA: Diagnosis not present

## 2019-08-28 DIAGNOSIS — Z23 Encounter for immunization: Secondary | ICD-10-CM | POA: Diagnosis not present

## 2019-09-04 DIAGNOSIS — E782 Mixed hyperlipidemia: Secondary | ICD-10-CM | POA: Diagnosis not present

## 2019-09-04 DIAGNOSIS — E559 Vitamin D deficiency, unspecified: Secondary | ICD-10-CM | POA: Diagnosis not present

## 2019-09-04 DIAGNOSIS — R7303 Prediabetes: Secondary | ICD-10-CM | POA: Diagnosis not present

## 2019-09-11 ENCOUNTER — Encounter: Payer: Self-pay | Admitting: Gynecology

## 2019-09-11 DIAGNOSIS — E782 Mixed hyperlipidemia: Secondary | ICD-10-CM | POA: Diagnosis not present

## 2019-09-11 DIAGNOSIS — R7303 Prediabetes: Secondary | ICD-10-CM | POA: Diagnosis not present

## 2019-09-11 DIAGNOSIS — J302 Other seasonal allergic rhinitis: Secondary | ICD-10-CM | POA: Diagnosis not present

## 2019-09-11 DIAGNOSIS — Z91018 Allergy to other foods: Secondary | ICD-10-CM | POA: Diagnosis not present

## 2019-09-18 DIAGNOSIS — Z23 Encounter for immunization: Secondary | ICD-10-CM | POA: Diagnosis not present

## 2019-12-24 ENCOUNTER — Other Ambulatory Visit: Payer: Self-pay | Admitting: Family Medicine

## 2019-12-24 DIAGNOSIS — Z1231 Encounter for screening mammogram for malignant neoplasm of breast: Secondary | ICD-10-CM

## 2020-01-18 DIAGNOSIS — E559 Vitamin D deficiency, unspecified: Secondary | ICD-10-CM | POA: Diagnosis not present

## 2020-01-18 DIAGNOSIS — M25521 Pain in right elbow: Secondary | ICD-10-CM | POA: Diagnosis not present

## 2020-01-24 ENCOUNTER — Other Ambulatory Visit: Payer: Self-pay

## 2020-01-24 ENCOUNTER — Ambulatory Visit
Admission: RE | Admit: 2020-01-24 | Discharge: 2020-01-24 | Disposition: A | Discharge status: Home / Self Care | Payer: BLUE CROSS/BLUE SHIELD | Source: Ambulatory Visit | Attending: Family Medicine | Admitting: Family Medicine

## 2020-01-24 DIAGNOSIS — Z1231 Encounter for screening mammogram for malignant neoplasm of breast: Secondary | ICD-10-CM | POA: Diagnosis not present

## 2020-01-25 DIAGNOSIS — M25521 Pain in right elbow: Secondary | ICD-10-CM | POA: Diagnosis not present

## 2020-01-25 DIAGNOSIS — M7701 Medial epicondylitis, right elbow: Secondary | ICD-10-CM | POA: Diagnosis not present

## 2020-01-28 ENCOUNTER — Other Ambulatory Visit: Payer: Self-pay | Admitting: Family Medicine

## 2020-01-28 DIAGNOSIS — R928 Other abnormal and inconclusive findings on diagnostic imaging of breast: Secondary | ICD-10-CM

## 2020-02-06 ENCOUNTER — Other Ambulatory Visit: Payer: Self-pay

## 2020-02-06 ENCOUNTER — Ambulatory Visit
Admission: RE | Admit: 2020-02-06 | Discharge: 2020-02-06 | Disposition: A | Discharge status: Home / Self Care | Payer: BC Managed Care – PPO | Source: Ambulatory Visit | Attending: Family Medicine | Admitting: Family Medicine

## 2020-02-06 DIAGNOSIS — R928 Other abnormal and inconclusive findings on diagnostic imaging of breast: Secondary | ICD-10-CM

## 2020-02-06 DIAGNOSIS — R922 Inconclusive mammogram: Secondary | ICD-10-CM | POA: Diagnosis not present

## 2020-02-06 DIAGNOSIS — N6012 Diffuse cystic mastopathy of left breast: Secondary | ICD-10-CM | POA: Diagnosis not present

## 2020-05-16 DIAGNOSIS — R062 Wheezing: Secondary | ICD-10-CM | POA: Diagnosis not present

## 2020-05-16 DIAGNOSIS — J0100 Acute maxillary sinusitis, unspecified: Secondary | ICD-10-CM | POA: Diagnosis not present

## 2020-05-21 DIAGNOSIS — Z20822 Contact with and (suspected) exposure to covid-19: Secondary | ICD-10-CM | POA: Diagnosis not present

## 2020-06-27 DIAGNOSIS — M7701 Medial epicondylitis, right elbow: Secondary | ICD-10-CM | POA: Diagnosis not present

## 2020-09-04 DIAGNOSIS — Z85828 Personal history of other malignant neoplasm of skin: Secondary | ICD-10-CM | POA: Diagnosis not present

## 2020-09-04 DIAGNOSIS — L821 Other seborrheic keratosis: Secondary | ICD-10-CM | POA: Diagnosis not present

## 2020-09-04 DIAGNOSIS — Z86018 Personal history of other benign neoplasm: Secondary | ICD-10-CM | POA: Diagnosis not present

## 2020-09-04 DIAGNOSIS — L814 Other melanin hyperpigmentation: Secondary | ICD-10-CM | POA: Diagnosis not present

## 2020-09-04 DIAGNOSIS — L570 Actinic keratosis: Secondary | ICD-10-CM | POA: Diagnosis not present

## 2020-10-28 DIAGNOSIS — Z8589 Personal history of malignant neoplasm of other organs and systems: Secondary | ICD-10-CM | POA: Diagnosis not present

## 2020-10-28 DIAGNOSIS — E559 Vitamin D deficiency, unspecified: Secondary | ICD-10-CM | POA: Diagnosis not present

## 2020-10-28 DIAGNOSIS — R7303 Prediabetes: Secondary | ICD-10-CM | POA: Diagnosis not present

## 2020-10-28 DIAGNOSIS — Z23 Encounter for immunization: Secondary | ICD-10-CM | POA: Diagnosis not present

## 2020-10-28 DIAGNOSIS — Z0000 Encounter for general adult medical examination without abnormal findings: Secondary | ICD-10-CM | POA: Diagnosis not present

## 2020-10-28 DIAGNOSIS — E782 Mixed hyperlipidemia: Secondary | ICD-10-CM | POA: Diagnosis not present

## 2021-03-13 ENCOUNTER — Other Ambulatory Visit: Payer: Self-pay | Admitting: Family Medicine

## 2021-03-13 DIAGNOSIS — Z1231 Encounter for screening mammogram for malignant neoplasm of breast: Secondary | ICD-10-CM

## 2021-03-19 DIAGNOSIS — L821 Other seborrheic keratosis: Secondary | ICD-10-CM | POA: Diagnosis not present

## 2021-04-27 ENCOUNTER — Other Ambulatory Visit: Payer: Self-pay

## 2021-04-27 ENCOUNTER — Ambulatory Visit
Admission: RE | Admit: 2021-04-27 | Discharge: 2021-04-27 | Disposition: A | Discharge status: Home / Self Care | Payer: BC Managed Care – PPO | Source: Ambulatory Visit | Attending: Family Medicine | Admitting: Family Medicine

## 2021-04-27 DIAGNOSIS — Z1231 Encounter for screening mammogram for malignant neoplasm of breast: Secondary | ICD-10-CM | POA: Diagnosis not present

## 2021-05-22 DIAGNOSIS — Z8709 Personal history of other diseases of the respiratory system: Secondary | ICD-10-CM | POA: Diagnosis not present

## 2021-05-22 DIAGNOSIS — Z03818 Encounter for observation for suspected exposure to other biological agents ruled out: Secondary | ICD-10-CM | POA: Diagnosis not present

## 2021-05-22 DIAGNOSIS — J40 Bronchitis, not specified as acute or chronic: Secondary | ICD-10-CM | POA: Diagnosis not present

## 2021-05-22 DIAGNOSIS — R059 Cough, unspecified: Secondary | ICD-10-CM | POA: Diagnosis not present

## 2021-05-22 DIAGNOSIS — R0981 Nasal congestion: Secondary | ICD-10-CM | POA: Diagnosis not present

## 2021-08-19 ENCOUNTER — Other Ambulatory Visit: Payer: Self-pay | Admitting: Internal Medicine

## 2021-08-19 DIAGNOSIS — J302 Other seasonal allergic rhinitis: Secondary | ICD-10-CM | POA: Diagnosis not present

## 2021-08-19 DIAGNOSIS — Z136 Encounter for screening for cardiovascular disorders: Secondary | ICD-10-CM

## 2021-08-19 DIAGNOSIS — E782 Mixed hyperlipidemia: Secondary | ICD-10-CM | POA: Diagnosis not present

## 2021-08-19 DIAGNOSIS — Z803 Family history of malignant neoplasm of breast: Secondary | ICD-10-CM | POA: Diagnosis not present

## 2021-08-19 DIAGNOSIS — E559 Vitamin D deficiency, unspecified: Secondary | ICD-10-CM | POA: Diagnosis not present

## 2021-08-24 DIAGNOSIS — Z85828 Personal history of other malignant neoplasm of skin: Secondary | ICD-10-CM | POA: Diagnosis not present

## 2021-08-24 DIAGNOSIS — Z86018 Personal history of other benign neoplasm: Secondary | ICD-10-CM | POA: Diagnosis not present

## 2021-08-24 DIAGNOSIS — L814 Other melanin hyperpigmentation: Secondary | ICD-10-CM | POA: Diagnosis not present

## 2021-08-24 DIAGNOSIS — L821 Other seborrheic keratosis: Secondary | ICD-10-CM | POA: Diagnosis not present

## 2021-09-07 DIAGNOSIS — E782 Mixed hyperlipidemia: Secondary | ICD-10-CM | POA: Diagnosis not present

## 2021-09-07 DIAGNOSIS — E559 Vitamin D deficiency, unspecified: Secondary | ICD-10-CM | POA: Diagnosis not present

## 2021-09-07 DIAGNOSIS — R7303 Prediabetes: Secondary | ICD-10-CM | POA: Diagnosis not present

## 2021-09-10 ENCOUNTER — Ambulatory Visit
Admission: RE | Admit: 2021-09-10 | Discharge: 2021-09-10 | Disposition: A | Discharge status: Home / Self Care | Payer: No Typology Code available for payment source | Source: Ambulatory Visit | Attending: Internal Medicine | Admitting: Internal Medicine

## 2021-09-10 ENCOUNTER — Other Ambulatory Visit: Payer: Self-pay

## 2021-09-10 DIAGNOSIS — E785 Hyperlipidemia, unspecified: Secondary | ICD-10-CM | POA: Diagnosis not present

## 2021-09-10 DIAGNOSIS — Z136 Encounter for screening for cardiovascular disorders: Secondary | ICD-10-CM

## 2021-09-11 ENCOUNTER — Other Ambulatory Visit: Payer: Self-pay | Admitting: Urology

## 2021-09-11 DIAGNOSIS — I700 Atherosclerosis of aorta: Secondary | ICD-10-CM | POA: Diagnosis not present

## 2021-09-11 DIAGNOSIS — N202 Calculus of kidney with calculus of ureter: Secondary | ICD-10-CM | POA: Diagnosis not present

## 2021-09-11 DIAGNOSIS — R102 Pelvic and perineal pain: Secondary | ICD-10-CM | POA: Diagnosis not present

## 2021-09-11 DIAGNOSIS — N201 Calculus of ureter: Secondary | ICD-10-CM

## 2021-09-11 DIAGNOSIS — N132 Hydronephrosis with renal and ureteral calculous obstruction: Secondary | ICD-10-CM | POA: Diagnosis not present

## 2021-09-14 ENCOUNTER — Ambulatory Visit (HOSPITAL_BASED_OUTPATIENT_CLINIC_OR_DEPARTMENT_OTHER)
Admission: RE | Admit: 2021-09-14 | Discharge: 2021-09-14 | Disposition: A | Discharge status: Home / Self Care | Payer: BC Managed Care – PPO | Attending: Urology | Admitting: Urology

## 2021-09-14 ENCOUNTER — Other Ambulatory Visit: Payer: Self-pay

## 2021-09-14 ENCOUNTER — Ambulatory Visit (HOSPITAL_COMMUNITY): Payer: BC Managed Care – PPO

## 2021-09-14 ENCOUNTER — Encounter (HOSPITAL_BASED_OUTPATIENT_CLINIC_OR_DEPARTMENT_OTHER)
Admission: RE | Disposition: A | Discharge status: Home / Self Care | Payer: Self-pay | Source: Home / Self Care | Attending: Urology

## 2021-09-14 ENCOUNTER — Encounter (HOSPITAL_BASED_OUTPATIENT_CLINIC_OR_DEPARTMENT_OTHER): Payer: Self-pay | Admitting: Urology

## 2021-09-14 DIAGNOSIS — N200 Calculus of kidney: Secondary | ICD-10-CM | POA: Diagnosis not present

## 2021-09-14 DIAGNOSIS — Z886 Allergy status to analgesic agent status: Secondary | ICD-10-CM | POA: Insufficient documentation

## 2021-09-14 DIAGNOSIS — Z882 Allergy status to sulfonamides status: Secondary | ICD-10-CM | POA: Diagnosis not present

## 2021-09-14 DIAGNOSIS — N201 Calculus of ureter: Secondary | ICD-10-CM

## 2021-09-14 DIAGNOSIS — E7800 Pure hypercholesterolemia, unspecified: Secondary | ICD-10-CM | POA: Diagnosis not present

## 2021-09-14 DIAGNOSIS — N202 Calculus of kidney with calculus of ureter: Secondary | ICD-10-CM | POA: Insufficient documentation

## 2021-09-14 DIAGNOSIS — Z841 Family history of disorders of kidney and ureter: Secondary | ICD-10-CM | POA: Insufficient documentation

## 2021-09-14 DIAGNOSIS — J45998 Other asthma: Secondary | ICD-10-CM | POA: Diagnosis not present

## 2021-09-14 HISTORY — PX: EXTRACORPOREAL SHOCK WAVE LITHOTRIPSY: SHX1557

## 2021-09-14 SURGERY — LITHOTRIPSY, ESWL
Anesthesia: LOCAL | Laterality: Left

## 2021-09-14 MED ORDER — TAMSULOSIN HCL 0.4 MG PO CAPS: 0.4000 mg | ORAL_CAPSULE | Freq: Every day | ORAL | 0 refills | Status: DC

## 2021-09-14 MED ORDER — DIAZEPAM 5 MG PO TABS
ORAL_TABLET | ORAL | Status: AC
  Filled 2021-09-14: qty 2

## 2021-09-14 MED ORDER — DIPHENHYDRAMINE HCL 25 MG PO CAPS
25.0000 mg | ORAL_CAPSULE | ORAL | Status: AC
  Administered 2021-09-14: 25 mg via ORAL

## 2021-09-14 MED ORDER — DIAZEPAM 5 MG PO TABS
10.0000 mg | ORAL_TABLET | ORAL | Status: AC
  Administered 2021-09-14: 10 mg via ORAL

## 2021-09-14 MED ORDER — DIPHENHYDRAMINE HCL 25 MG PO CAPS
ORAL_CAPSULE | ORAL | Status: AC
  Filled 2021-09-14: qty 1

## 2021-09-14 MED ORDER — CIPROFLOXACIN HCL 500 MG PO TABS
ORAL_TABLET | ORAL | Status: AC
  Filled 2021-09-14: qty 1

## 2021-09-14 MED ORDER — SODIUM CHLORIDE 0.9 % IV SOLN: INTRAVENOUS | Status: DC

## 2021-09-14 MED ORDER — CIPROFLOXACIN HCL 500 MG PO TABS
500.0000 mg | ORAL_TABLET | ORAL | Status: AC
  Administered 2021-09-14: 500 mg via ORAL

## 2021-09-14 NOTE — Op Note (Signed)
See [facility] OP note scanned into chart. Also because of the size, density, location and other factors that cannot be anticipated I feel this will likely be a staged procedure. This fact supersedes any indication in the scanned [facility] operative note to the contrary.  

## 2021-09-14 NOTE — Discharge Instructions (Addendum)
See [facility] discharge instructions in chart.   Post Anesthesia Home Care Instructions  Activity: Get plenty of rest for the remainder of the day. A responsible individual must stay with you for 24 hours following the procedure.  For the next 24 hours, DO NOT: -Drive a car -Operate machinery -Drink alcoholic beverages -Take any medication unless instructed by your physician -Make any legal decisions or sign important papers.  Meals: Start with liquid foods such as gelatin or soup. Progress to regular foods as tolerated. Avoid greasy, spicy, heavy foods. If nausea and/or vomiting occur, drink only clear liquids until the nausea and/or vomiting subsides. Call your physician if vomiting continues.  Special Instructions/Symptoms: Your throat may feel dry or sore from the anesthesia or the breathing tube placed in your throat during surgery. If this causes discomfort, gargle with warm salt water. The discomfort should disappear within 24 hours.       

## 2021-09-14 NOTE — H&P (Signed)
I have ureteral stone.  HPI: Deborah Phillips is a 60 year-old female patient who is here for ureteral stone.  The problem is on the right side. This is not her first kidney stone. She is currently having flank pain and back pain. She denies having groin pain, nausea, vomiting, fever, and chills. Pain is occuring on the right side. She has not caught a stone in her urine strainer since her symptoms began.   This woman has been followed conservatively for a left ureteral stone which originally with symptomatic in November of last year. She had not had any symptoms since her last visit here couple of months ago. She has no lower urinary tract issues and has had no blood in her urine. She has not felt the stone pass. CT shows a 24mm left UPJ calculus and multiple left lower pole calculi.   09/11/2021: Starting 1 week ago she developed aching right flank pain that has failed to improve. She saw gross hematuria this morning. UA today shows 3-10 RBCs/hpf.     ALLERGIES: Aspirin - Trouble Breathing Ibuprofen - Trouble Breathing Sulfa - Other Reaction, abd pain    MEDICATIONS: Atorvastatin Calcium  Fish Oil  Flonase Allergy Relief  Multivitamin  Vitamin D2     GU PSH: Ureteroscopic stone removal     NON-GU PSH: None   GU PMH: Renal and ureteral calculus (Improving), Left distal ureteral stone-it looks as if this has passed. She has multiple bilateral renal calculi, left greater than right. - 2018 Ureteral calculus (Stable), Persistent but minimally symptomatic left distal ureteral stone - 2017 Dysuria (Acute), Culture urine. Uribel 118 mg 1 po TID PRN. Offered pt KUB today but refused. She will f/u as scheduled tomorrow for KUB and OV w/Dr. Retta Diones. If stone still not seen on KUB may need repeat CT urogram - 2017 Renal calculus - 2017 Urinary Frequency - 2017      PMH Notes: Skin Cancer   NON-GU PMH: Elevated cancer antigen 125 [CA 125] Hypercholesterolemia Other asthma Skin Cancer,  History    FAMILY HISTORY: 3 daughters - Other Breast Cancer - Mother, Sister, Daughter father deceased at age 79 - Other Kidney Stones - Brother Lung Cancer - Mother mother deceased at age 46 - Other Ulcer - Father    Notes: Mother died of Lung cancer  Father died of Bleeding ulcer   SOCIAL HISTORY: Marital Status: Married Preferred Language: English; Ethnicity: Not Hispanic Or Latino; Race: White Current Smoking Status: Patient has never smoked.  Has never drank.  Drinks 1 caffeinated drink per day. 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    REVIEW OF SYSTEMS:    GU Review Female:   Patient reports burning /pain with urination. Patient denies frequent urination, hard to postpone urination, get up at night to urinate, leakage of urine, stream starts and stops, trouble starting your stream, have to strain to urinate, and being pregnant.  Gastrointestinal (Upper):   Patient denies nausea, vomiting, and indigestion/ heartburn.  Gastrointestinal (Lower):   Patient denies diarrhea and constipation.  Constitutional:   Patient denies fever, night sweats, weight loss, and fatigue.  Skin:   Patient denies skin rash/ lesion and itching.  Eyes:   Patient denies blurred vision and double vision.  Ears/ Nose/ Throat:   Patient denies sore throat and sinus problems.  Hematologic/Lymphatic:   Patient denies swollen glands and easy bruising.  Cardiovascular:   Patient denies leg swelling and chest pains.  Respiratory:   Patient denies cough and shortness  of breath.  Endocrine:   Patient denies excessive thirst.  Musculoskeletal:   Patient denies back pain and joint pain.  Neurological:   Patient denies headaches and dizziness.  Psychologic:   Patient denies depression and anxiety.   Notes: blood in urine; lower abdominal pain; right flank pain    VITAL SIGNS:      09/11/2021 10:45 AM  Weight 112 lb / 50.8 kg  Height 60 in / 152.4 cm  BP 147/85 mmHg  Pulse 69 /min   Temperature 98.2 F / 36.7 C  BMI 21.9 kg/m   GU PHYSICAL EXAMINATION:    External Genitalia: No hirsutism, no rash, no scarring, no cyst, no erythematous lesion, no papular lesion, no blanched lesion, no warty lesion. No edema.  Urethral Meatus: Normal size. Normal position. No discharge.  Urethra: No tenderness, no mass, no scarring. No hypermobility. No leakage.  Bladder: Normal to palpation, no tenderness, no mass, normal size.  Vagina: No atrophy, no stenosis. No rectocele. No cystocele. No enterocele.  Cervix: No inflammation, no discharge, no lesion, no tenderness, no wart.  Uterus: Normal size. Normal consistency. Normal position. No mobility. No descent.  Adnexa / Parametria: No tenderness. No adnexal mass. Normal left ovary. Normal right ovary.  Anus and Perineum: No hemorrhoids. No anal stenosis. No rectal fissure, no anal fissure. No edema, no dimple, no perineal tenderness, no anal tenderness.   MULTI-SYSTEM PHYSICAL EXAMINATION:    Constitutional: Well-nourished. No physical deformities. Normally developed. Good grooming.  Neck: Neck symmetrical, not swollen. Normal tracheal position.  Respiratory: No labored breathing, no use of accessory muscles.   Cardiovascular: Normal temperature, normal extremity pulses, no swelling, no varicosities.  Lymphatic: No enlargement of neck, axillae, groin.  Skin: No paleness, no jaundice, no cyanosis. No lesion, no ulcer, no rash.  Neurologic / Psychiatric: Oriented to time, oriented to place, oriented to person. No depression, no anxiety, no agitation.  Gastrointestinal: No mass, no tenderness, no rigidity, non obese abdomen.  Eyes: Normal conjunctivae. Normal eyelids.  Ears, Nose, Mouth, and Throat: Left ear no scars, no lesions, no masses. Right ear no scars, no lesions, no masses. Nose no scars, no lesions, no masses. Normal hearing. Normal lips.  Musculoskeletal: Normal gait and station of head and neck.     Complexity of Data:   X-Ray Review: C.T. Stone Protocol: Reviewed Films. Discussed With Patient.     PROCEDURES:         C.T. Urogram - [id number]      Patient confirmed No Neulasta OnPro Device.         Urinalysis w/Scope - 81001 Dipstick Dipstick Cont'd Micro  Color: Straw Bilirubin: Neg WBC/hpf: 0 - 5/hpf  Appearance: Clear Ketones: Neg RBC/hpf: 3 - 10/hpf  Specific Gravity: 1.010 Blood: 2+ Bacteria: NS (Not Seen)  pH: 6.0 Protein: Neg Cystals: NS (Not Seen)  Glucose: Neg Urobilinogen: 0.2 Casts: NS (Not Seen)    Nitrites: Neg Trichomonas: Not Present    Leukocyte Esterase: Neg Mucous: Not Present      Epithelial Cells: NS (Not Seen)      Yeast: NS (Not Seen)      Sperm: Not Present    Notes:      ASSESSMENT:      ICD-10 Details  1 GU:   Renal and ureteral calculus - N20.2 Left, Acute, Systemic Symptoms   PLAN:            Medications New Meds: Flomax 0.4 mg capsule 1 capsule PO Q HS   #  30  0 Refill(s)  Percocet 5 mg-325 mg tablet 1 tablet PO Q 4 H PRN   #30  0 Refill(s)  Ondansetron Hcl 4 mg tablet 1 tablet PO Q 8 H   #30  1 Refill(s)    Stop Meds: Uribel 118 mg-10 mg-40.8 mg-36 mg-0.12 mg capsule 1 capsule PO TID PRN  Start: 12/01/2016  Discontinue: 09/11/2021  - Reason: The medication cycle was completed.            Orders X-Rays: C.T. Stone Protocol Without I.V. Contrast          Schedule         Document Letter(s):  Created for Patient: Clinical Summary         Notes:   left ureteral calculus:  -We discussed medical expulsive therapy, ESWL, ureteroscopic stone extraction and PCNL. After discussing the treatment options the patient elects for left ESWL

## 2021-09-15 ENCOUNTER — Encounter (HOSPITAL_BASED_OUTPATIENT_CLINIC_OR_DEPARTMENT_OTHER): Payer: Self-pay | Admitting: Urology

## 2021-09-26 DIAGNOSIS — Z23 Encounter for immunization: Secondary | ICD-10-CM | POA: Diagnosis not present

## 2021-10-03 ENCOUNTER — Other Ambulatory Visit: Payer: Self-pay | Admitting: Urology

## 2021-10-08 DIAGNOSIS — Z0000 Encounter for general adult medical examination without abnormal findings: Secondary | ICD-10-CM | POA: Diagnosis not present

## 2021-10-08 DIAGNOSIS — Z124 Encounter for screening for malignant neoplasm of cervix: Secondary | ICD-10-CM | POA: Diagnosis not present

## 2021-10-28 DIAGNOSIS — N202 Calculus of kidney with calculus of ureter: Secondary | ICD-10-CM | POA: Diagnosis not present

## 2021-12-24 DIAGNOSIS — N200 Calculus of kidney: Secondary | ICD-10-CM | POA: Diagnosis not present

## 2021-12-30 DIAGNOSIS — R7303 Prediabetes: Secondary | ICD-10-CM | POA: Diagnosis not present

## 2022-01-27 DIAGNOSIS — R82994 Hypercalciuria: Secondary | ICD-10-CM | POA: Diagnosis not present

## 2022-01-27 DIAGNOSIS — N202 Calculus of kidney with calculus of ureter: Secondary | ICD-10-CM | POA: Diagnosis not present

## 2022-02-10 DIAGNOSIS — K648 Other hemorrhoids: Secondary | ICD-10-CM | POA: Diagnosis not present

## 2022-02-10 DIAGNOSIS — D122 Benign neoplasm of ascending colon: Secondary | ICD-10-CM | POA: Diagnosis not present

## 2022-02-10 DIAGNOSIS — Z1211 Encounter for screening for malignant neoplasm of colon: Secondary | ICD-10-CM | POA: Diagnosis not present

## 2022-03-25 ENCOUNTER — Other Ambulatory Visit: Payer: Self-pay | Admitting: Internal Medicine

## 2022-03-25 DIAGNOSIS — Z1231 Encounter for screening mammogram for malignant neoplasm of breast: Secondary | ICD-10-CM

## 2022-03-25 DIAGNOSIS — Z803 Family history of malignant neoplasm of breast: Secondary | ICD-10-CM

## 2022-04-16 ENCOUNTER — Other Ambulatory Visit: Payer: Self-pay | Admitting: Internal Medicine

## 2022-04-16 ENCOUNTER — Ambulatory Visit
Admission: RE | Admit: 2022-04-16 | Discharge: 2022-04-16 | Disposition: A | Discharge status: Home / Self Care | Payer: BC Managed Care – PPO | Source: Ambulatory Visit | Attending: Internal Medicine | Admitting: Internal Medicine

## 2022-04-16 DIAGNOSIS — Z803 Family history of malignant neoplasm of breast: Secondary | ICD-10-CM | POA: Diagnosis not present

## 2022-04-16 DIAGNOSIS — R9389 Abnormal findings on diagnostic imaging of other specified body structures: Secondary | ICD-10-CM

## 2022-04-16 DIAGNOSIS — R928 Other abnormal and inconclusive findings on diagnostic imaging of breast: Secondary | ICD-10-CM | POA: Diagnosis not present

## 2022-04-16 MED ORDER — GADOBUTROL 1 MMOL/ML IV SOLN
5.0000 mL | Freq: Once | INTRAVENOUS | Status: AC | PRN
  Administered 2022-04-16: 5 mL via INTRAVENOUS

## 2022-04-28 ENCOUNTER — Ambulatory Visit
Admission: RE | Admit: 2022-04-28 | Discharge: 2022-04-28 | Disposition: A | Discharge status: Home / Self Care | Payer: BC Managed Care – PPO | Source: Ambulatory Visit | Attending: Internal Medicine | Admitting: Internal Medicine

## 2022-04-28 ENCOUNTER — Other Ambulatory Visit (HOSPITAL_COMMUNITY): Payer: Self-pay | Admitting: Diagnostic Radiology

## 2022-04-28 DIAGNOSIS — N6312 Unspecified lump in the right breast, upper inner quadrant: Secondary | ICD-10-CM | POA: Diagnosis not present

## 2022-04-28 DIAGNOSIS — Z803 Family history of malignant neoplasm of breast: Secondary | ICD-10-CM

## 2022-04-28 DIAGNOSIS — N6011 Diffuse cystic mastopathy of right breast: Secondary | ICD-10-CM | POA: Diagnosis not present

## 2022-04-28 DIAGNOSIS — R9389 Abnormal findings on diagnostic imaging of other specified body structures: Secondary | ICD-10-CM

## 2022-04-28 HISTORY — PX: BREAST BIOPSY: SHX20

## 2022-05-24 ENCOUNTER — Other Ambulatory Visit: Payer: Self-pay | Admitting: Internal Medicine

## 2022-05-24 DIAGNOSIS — Z1231 Encounter for screening mammogram for malignant neoplasm of breast: Secondary | ICD-10-CM

## 2022-05-25 ENCOUNTER — Ambulatory Visit: Payer: BC Managed Care – PPO

## 2022-05-27 ENCOUNTER — Ambulatory Visit: Payer: BC Managed Care – PPO

## 2022-10-06 IMAGING — MG MM DIGITAL SCREENING BILAT W/ TOMO AND CAD
8 series · 9 of 24 positions shown · non-contrast
Comparison: Previous exam(s).

CLINICAL DATA: Screening.

EXAM:
DIGITAL SCREENING BILATERAL MAMMOGRAM WITH TOMOSYNTHESIS AND CAD
TECHNIQUE: Bilateral screening digital craniocaudal and mediolateral oblique
mammograms were obtained. Bilateral screening digital breast
tomosynthesis was performed. The images were evaluated with
computer-aided detection.

[L CC synth-2D]
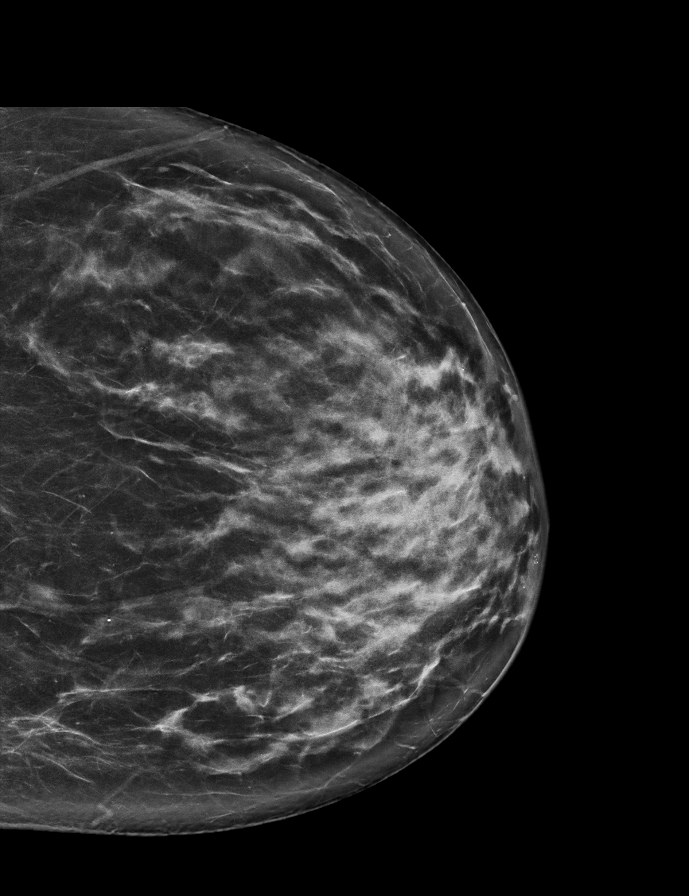

[R MLO synth-2D]
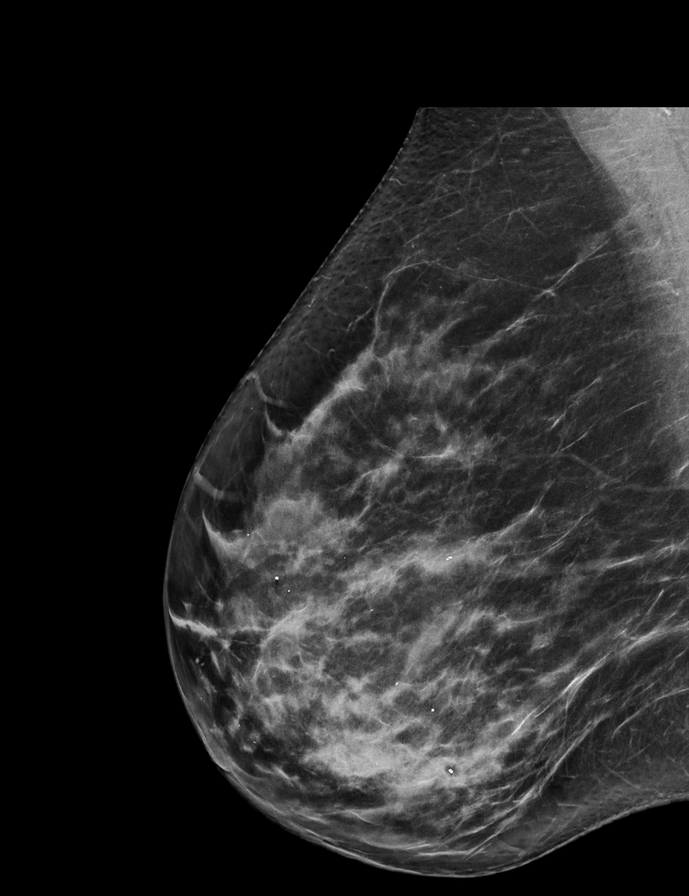

[R CC synth-2D]
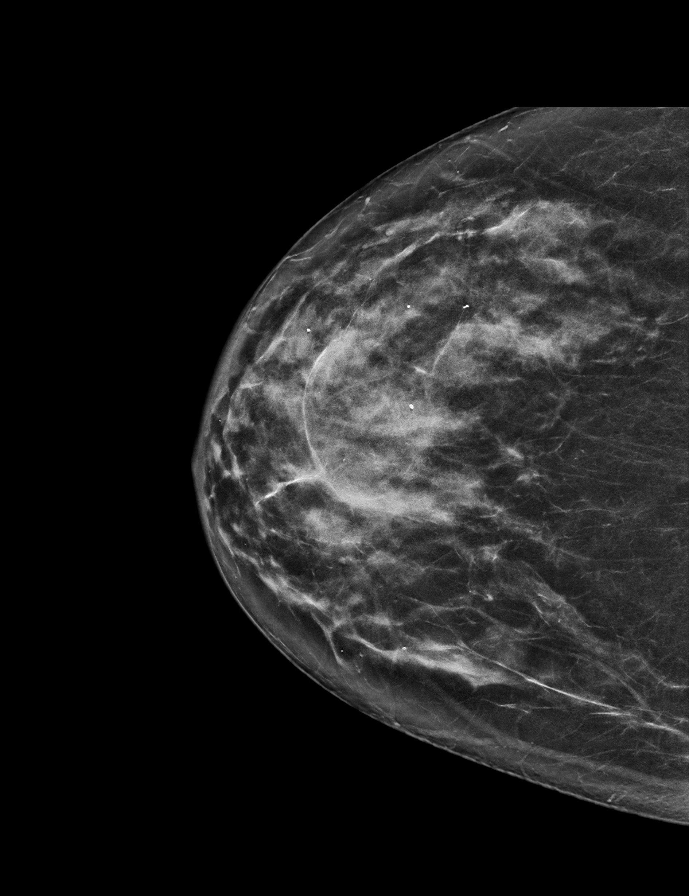

[L MLO synth-2D]
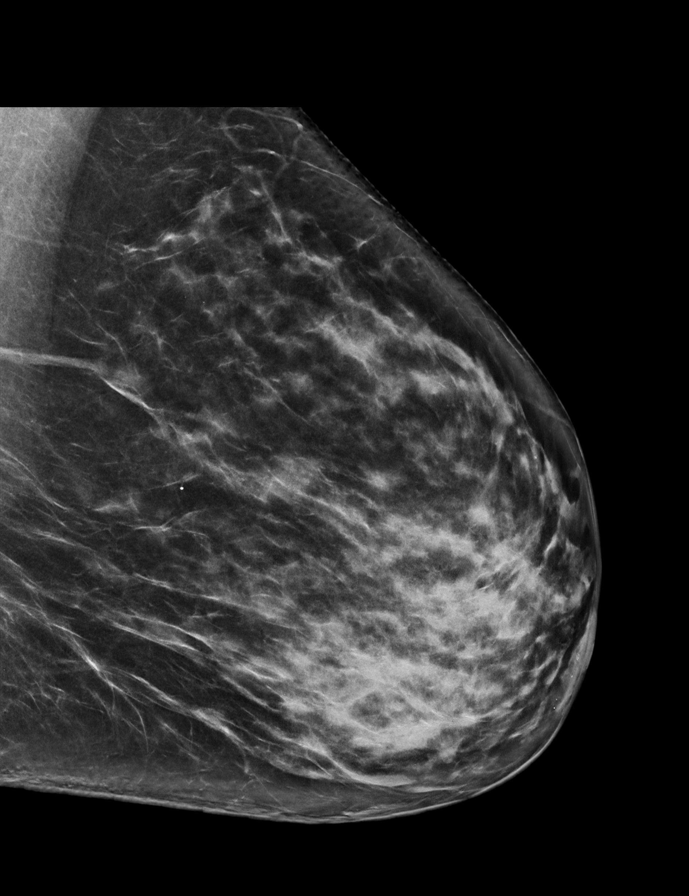

[R MLO tomo · 2 of 72 frames shown]
[frame 24/72]
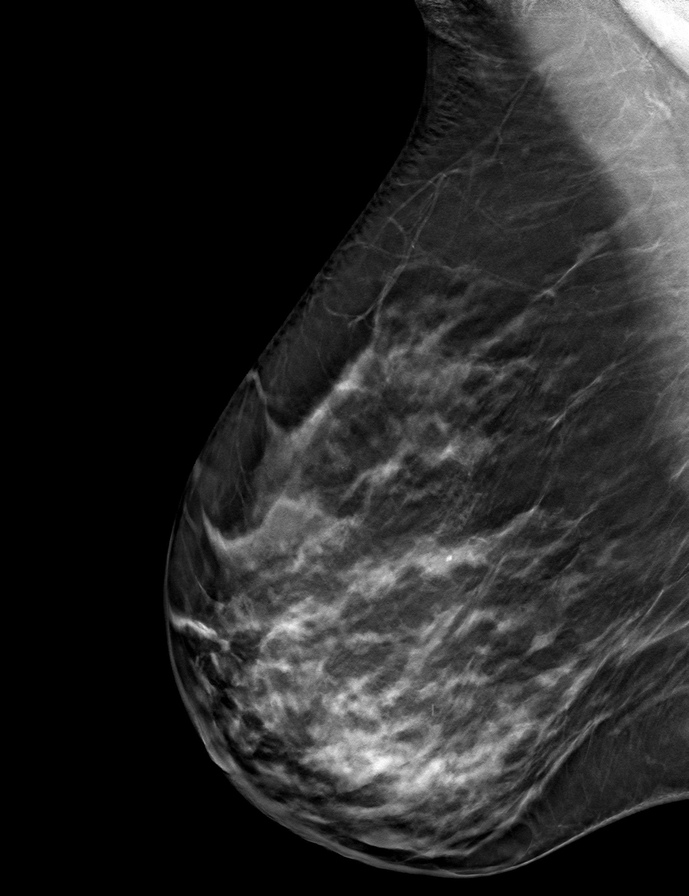
[frame 37/72]
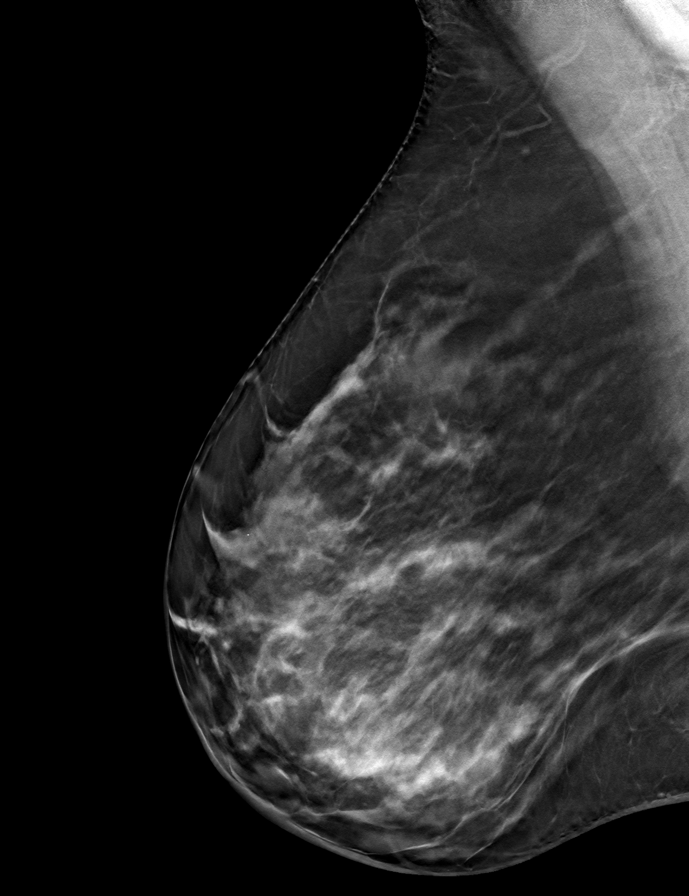

[R CC tomo · tomo slice 35/68.0]
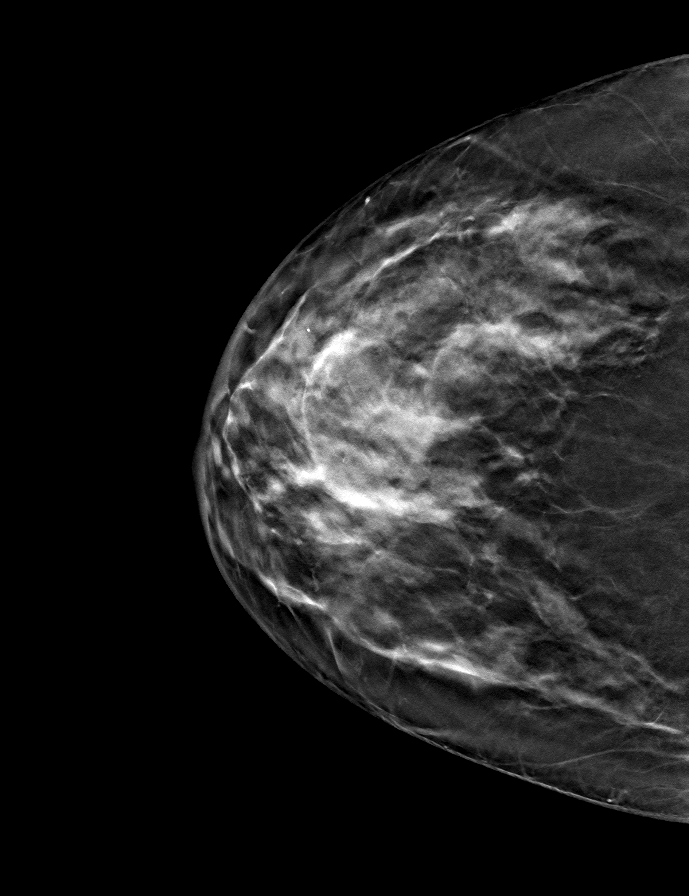

[L MLO tomo · tomo slice 41/81.0]
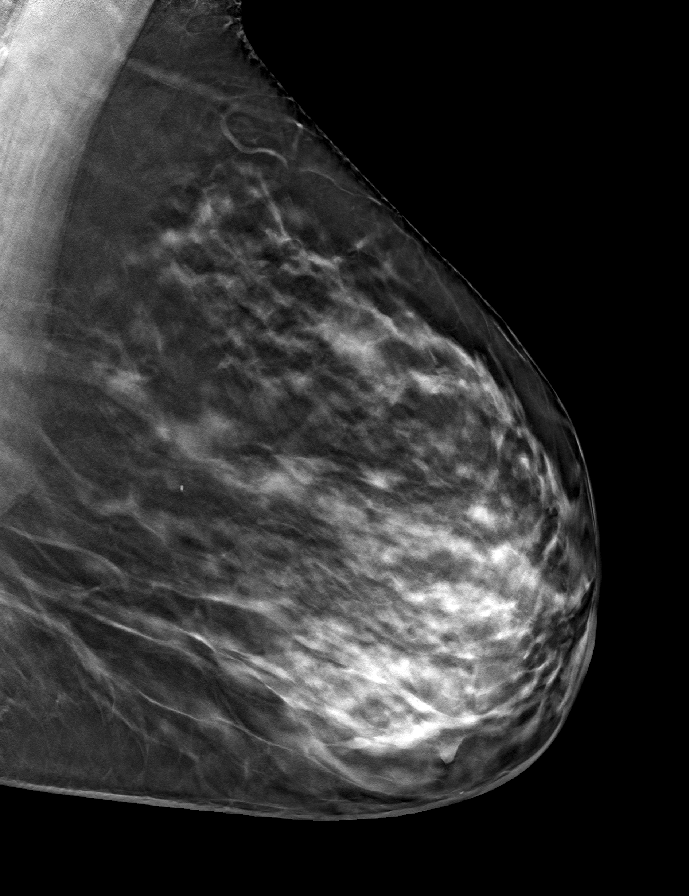

[L CC tomo · tomo slice 37/72.0]
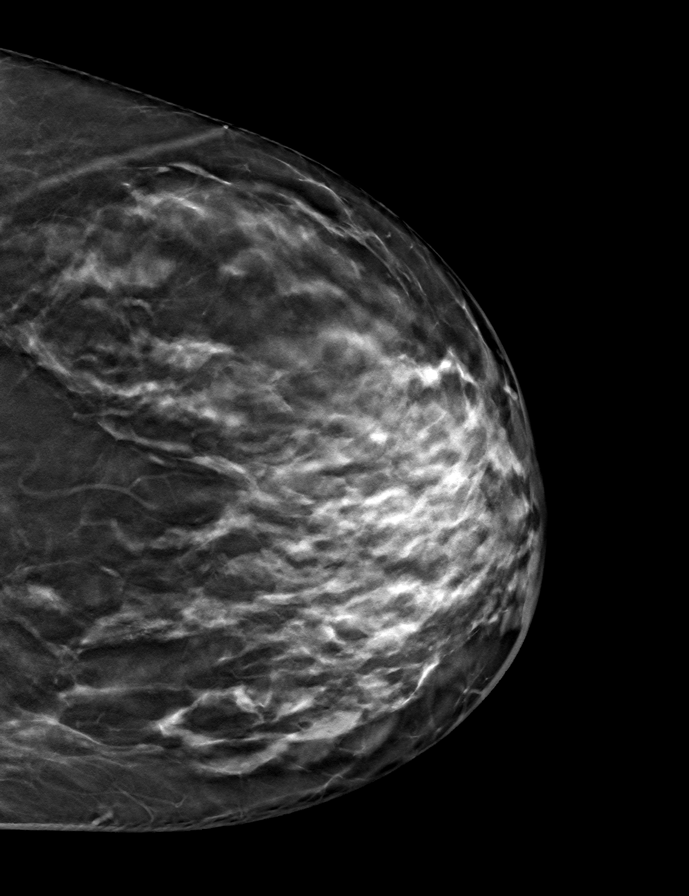

[9 of 24 positions shown; findings below may reference images not displayed]

ACR Breast Density Category c: The breast tissue is heterogeneously
dense, which may obscure small masses.
FINDINGS: There are no findings suspicious for malignancy. The images were
evaluated with computer-aided detection.
IMPRESSION: No mammographic evidence of malignancy. A result letter of this
screening mammogram will be mailed directly to the patient.

RECOMMENDATION:
Screening mammogram in one year. (Code:T4-5-GWO)

BI-RADS CATEGORY  1: Negative.

## 2022-10-08 ENCOUNTER — Ambulatory Visit
Admission: RE | Admit: 2022-10-08 | Discharge: 2022-10-08 | Disposition: A | Discharge status: Home / Self Care | Payer: BC Managed Care – PPO | Source: Ambulatory Visit | Attending: Internal Medicine | Admitting: Internal Medicine

## 2022-10-08 DIAGNOSIS — Z1231 Encounter for screening mammogram for malignant neoplasm of breast: Secondary | ICD-10-CM

## 2023-05-16 ENCOUNTER — Ambulatory Visit: Payer: BC Managed Care – PPO

## 2023-05-16 ENCOUNTER — Other Ambulatory Visit: Payer: Self-pay | Admitting: Internal Medicine

## 2023-05-16 DIAGNOSIS — Z0000 Encounter for general adult medical examination without abnormal findings: Secondary | ICD-10-CM

## 2023-11-03 ENCOUNTER — Ambulatory Visit
Admission: RE | Admit: 2023-11-03 | Discharge: 2023-11-03 | Disposition: A | Discharge status: Home / Self Care | Payer: BLUE CROSS/BLUE SHIELD | Source: Ambulatory Visit | Attending: Internal Medicine | Admitting: Internal Medicine

## 2023-11-03 DIAGNOSIS — Z0000 Encounter for general adult medical examination without abnormal findings: Secondary | ICD-10-CM

## 2023-11-07 ENCOUNTER — Ambulatory Visit: Payer: BC Managed Care – PPO

## 2024-10-04 ENCOUNTER — Other Ambulatory Visit: Payer: Self-pay | Admitting: Internal Medicine

## 2024-10-04 DIAGNOSIS — Z1231 Encounter for screening mammogram for malignant neoplasm of breast: Secondary | ICD-10-CM

## 2024-11-05 ENCOUNTER — Ambulatory Visit
Admission: RE | Admit: 2024-11-05 | Discharge: 2024-11-05 | Disposition: A | Discharge status: Home / Self Care | Source: Ambulatory Visit | Attending: Internal Medicine | Admitting: Internal Medicine

## 2024-11-05 DIAGNOSIS — Z1231 Encounter for screening mammogram for malignant neoplasm of breast: Secondary | ICD-10-CM
